# Patient Record
Sex: Female | Born: 1970 | Race: White | Hispanic: No | Marital: Married | Smoking: Never smoker
Health system: Southern US, Community
[De-identification: ages and names within clinical notes are randomized; demographics above are authoritative.]

## PROBLEM LIST (undated history)

## (undated) HISTORY — PX: HIP SURGERY: SHX245

## (undated) HISTORY — PX: ABDOMINAL HYSTERECTOMY: SHX81

---

## 2001-08-25 ENCOUNTER — Other Ambulatory Visit: Admission: RE | Admit: 2001-08-25 | Discharge: 2001-08-25 | Payer: Self-pay | Admitting: Obstetrics and Gynecology

## 2005-07-03 ENCOUNTER — Inpatient Hospital Stay (HOSPITAL_COMMUNITY): Admission: RE | Admit: 2005-07-03 | Discharge: 2005-07-07 | Payer: Self-pay | Admitting: Orthopedic Surgery

## 2020-12-10 ENCOUNTER — Observation Stay (HOSPITAL_COMMUNITY)
Admission: EM | Admit: 2020-12-10 | Discharge: 2020-12-11 | Disposition: A | Discharge status: Home / Self Care | Payer: BC Managed Care – PPO | Attending: Orthopedic Surgery | Admitting: Orthopedic Surgery

## 2020-12-10 ENCOUNTER — Emergency Department (HOSPITAL_COMMUNITY): Payer: BC Managed Care – PPO | Admitting: Certified Registered Nurse Anesthetist

## 2020-12-10 ENCOUNTER — Encounter (HOSPITAL_COMMUNITY): Payer: Self-pay | Admitting: *Deleted

## 2020-12-10 ENCOUNTER — Encounter (HOSPITAL_COMMUNITY)
Admission: EM | Disposition: A | Discharge status: Home / Self Care | Payer: Self-pay | Source: Home / Self Care | Attending: Emergency Medicine

## 2020-12-10 ENCOUNTER — Other Ambulatory Visit: Payer: Self-pay

## 2020-12-10 ENCOUNTER — Emergency Department (HOSPITAL_COMMUNITY): Payer: BC Managed Care – PPO

## 2020-12-10 ENCOUNTER — Observation Stay (HOSPITAL_COMMUNITY): Payer: BC Managed Care – PPO

## 2020-12-10 DIAGNOSIS — Y829 Unspecified medical devices associated with adverse incidents: Secondary | ICD-10-CM | POA: Diagnosis not present

## 2020-12-10 DIAGNOSIS — Z20822 Contact with and (suspected) exposure to covid-19: Secondary | ICD-10-CM | POA: Insufficient documentation

## 2020-12-10 DIAGNOSIS — T84020A Dislocation of internal right hip prosthesis, initial encounter: Principal | ICD-10-CM | POA: Insufficient documentation

## 2020-12-10 DIAGNOSIS — Z96641 Presence of right artificial hip joint: Secondary | ICD-10-CM | POA: Diagnosis present

## 2020-12-10 DIAGNOSIS — S73004A Unspecified dislocation of right hip, initial encounter: Secondary | ICD-10-CM

## 2020-12-10 HISTORY — PX: HIP CLOSED REDUCTION: SHX983

## 2020-12-10 LAB — POC URINE PREG, ED: Preg Test, Ur: NEGATIVE

## 2020-12-10 LAB — RESP PANEL BY RT-PCR (FLU A&B, COVID) ARPGX2
Influenza A by PCR: NEGATIVE
Influenza B by PCR: NEGATIVE
SARS Coronavirus 2 by RT PCR: NEGATIVE

## 2020-12-10 SURGERY — CLOSED MANIPULATION, JOINT, HIP
Anesthesia: General | Site: Hip

## 2020-12-10 MED ORDER — OXYCODONE HCL 5 MG PO TABS
5.0000 mg | ORAL_TABLET | Freq: Once | ORAL | Status: AC | PRN
  Administered 2020-12-10: 5 mg via ORAL

## 2020-12-10 MED ORDER — CEFAZOLIN SODIUM-DEXTROSE 2-4 GM/100ML-% IV SOLN
2.0000 g | Freq: Four times a day (QID) | INTRAVENOUS | Status: AC
Start: 2020-12-10 — End: 2020-12-11
  Administered 2020-12-10 – 2020-12-11 (×2): 2 g via INTRAVENOUS
  Filled 2020-12-10 (×2): qty 100

## 2020-12-10 MED ORDER — 0.9 % SODIUM CHLORIDE (POUR BTL) OPTIME
TOPICAL | Status: DC | PRN
  Administered 2020-12-10: 1000 mL

## 2020-12-10 MED ORDER — TRANEXAMIC ACID-NACL 1000-0.7 MG/100ML-% IV SOLN
1000.0000 mg | Freq: Once | INTRAVENOUS | Status: AC
  Administered 2020-12-10: 1000 mg via INTRAVENOUS
  Filled 2020-12-10: qty 100

## 2020-12-10 MED ORDER — CEFAZOLIN SODIUM-DEXTROSE 2-4 GM/100ML-% IV SOLN
INTRAVENOUS | Status: AC
  Filled 2020-12-10: qty 100

## 2020-12-10 MED ORDER — ONDANSETRON HCL 4 MG/2ML IJ SOLN: 4.0000 mg | Freq: Once | INTRAMUSCULAR | Status: DC | PRN

## 2020-12-10 MED ORDER — HYDROMORPHONE HCL 1 MG/ML IJ SOLN
INTRAMUSCULAR | Status: AC
  Filled 2020-12-10: qty 1

## 2020-12-10 MED ORDER — FENTANYL CITRATE (PF) 100 MCG/2ML IJ SOLN
INTRAMUSCULAR | Status: DC | PRN
  Administered 2020-12-10: 50 ug via INTRAVENOUS
  Administered 2020-12-10: 100 ug via INTRAVENOUS
  Administered 2020-12-10: 50 ug via INTRAVENOUS

## 2020-12-10 MED ORDER — OXYCODONE HCL 5 MG/5ML PO SOLN: 5.0000 mg | Freq: Once | ORAL | Status: AC | PRN

## 2020-12-10 MED ORDER — TRANEXAMIC ACID-NACL 1000-0.7 MG/100ML-% IV SOLN
INTRAVENOUS | Status: AC
  Filled 2020-12-10: qty 100

## 2020-12-10 MED ORDER — FENTANYL CITRATE (PF) 100 MCG/2ML IJ SOLN
INTRAMUSCULAR | Status: AC
  Filled 2020-12-10: qty 2

## 2020-12-10 MED ORDER — HYDROMORPHONE HCL 1 MG/ML IJ SOLN: 0.5000 mg | INTRAMUSCULAR | Status: DC | PRN

## 2020-12-10 MED ORDER — ONDANSETRON HCL 4 MG/2ML IJ SOLN
INTRAMUSCULAR | Status: AC
  Filled 2020-12-10: qty 2

## 2020-12-10 MED ORDER — ACETAMINOPHEN 325 MG PO TABS: 325.0000 mg | ORAL_TABLET | Freq: Four times a day (QID) | ORAL | Status: DC | PRN

## 2020-12-10 MED ORDER — METOCLOPRAMIDE HCL 5 MG/ML IJ SOLN
5.0000 mg | Freq: Once | INTRAMUSCULAR | Status: AC
  Administered 2020-12-10: 5 mg via INTRAVENOUS
  Filled 2020-12-10: qty 2

## 2020-12-10 MED ORDER — MIDAZOLAM HCL 5 MG/5ML IJ SOLN
INTRAMUSCULAR | Status: DC | PRN
  Administered 2020-12-10: 2 mg via INTRAVENOUS

## 2020-12-10 MED ORDER — HYDROMORPHONE HCL 1 MG/ML IJ SOLN
0.5000 mg | INTRAMUSCULAR | Status: DC | PRN
  Administered 2020-12-10: 0.5 mg via INTRAVENOUS
  Filled 2020-12-10: qty 1

## 2020-12-10 MED ORDER — LACTATED RINGERS IV SOLN: INTRAVENOUS | Status: DC | PRN

## 2020-12-10 MED ORDER — SUGAMMADEX SODIUM 200 MG/2ML IV SOLN
INTRAVENOUS | Status: DC | PRN
  Administered 2020-12-10: 200 mg via INTRAVENOUS

## 2020-12-10 MED ORDER — CEFAZOLIN SODIUM-DEXTROSE 2-3 GM-%(50ML) IV SOLR
INTRAVENOUS | Status: DC | PRN
  Administered 2020-12-10: 2 g via INTRAVENOUS

## 2020-12-10 MED ORDER — LACTATED RINGERS IV SOLN: INTRAVENOUS | Status: DC

## 2020-12-10 MED ORDER — LIDOCAINE HCL (PF) 2 % IJ SOLN
INTRAMUSCULAR | Status: AC
  Filled 2020-12-10: qty 5

## 2020-12-10 MED ORDER — POLYETHYLENE GLYCOL 3350 17 G PO PACK: 17.0000 g | PACK | Freq: Every day | ORAL | Status: DC | PRN

## 2020-12-10 MED ORDER — ASPIRIN 81 MG PO CHEW
81.0000 mg | CHEWABLE_TABLET | Freq: Two times a day (BID) | ORAL | Status: DC
  Administered 2020-12-10 – 2020-12-11 (×2): 81 mg via ORAL
  Filled 2020-12-10 (×2): qty 1

## 2020-12-10 MED ORDER — PROPOFOL 10 MG/ML IV BOLUS
INTRAVENOUS | Status: AC
  Filled 2020-12-10: qty 20

## 2020-12-10 MED ORDER — MIDAZOLAM HCL 2 MG/2ML IJ SOLN
INTRAMUSCULAR | Status: AC
  Filled 2020-12-10: qty 2

## 2020-12-10 MED ORDER — SODIUM CHLORIDE 0.9 % IR SOLN
Status: DC | PRN
  Administered 2020-12-10: 3000 mL

## 2020-12-10 MED ORDER — ONDANSETRON HCL 4 MG/2ML IJ SOLN: 4.0000 mg | Freq: Four times a day (QID) | INTRAMUSCULAR | Status: DC | PRN

## 2020-12-10 MED ORDER — ONDANSETRON HCL 4 MG/2ML IJ SOLN
4.0000 mg | Freq: Once | INTRAMUSCULAR | Status: AC
  Administered 2020-12-10: 4 mg via INTRAVENOUS
  Filled 2020-12-10: qty 2

## 2020-12-10 MED ORDER — TRANEXAMIC ACID-NACL 1000-0.7 MG/100ML-% IV SOLN
INTRAVENOUS | Status: DC | PRN
  Administered 2020-12-10: 1000 mg via INTRAVENOUS

## 2020-12-10 MED ORDER — ACETAMINOPHEN 10 MG/ML IV SOLN
INTRAVENOUS | Status: AC
  Filled 2020-12-10: qty 100

## 2020-12-10 MED ORDER — DOCUSATE SODIUM 100 MG PO CAPS
100.0000 mg | ORAL_CAPSULE | Freq: Two times a day (BID) | ORAL | Status: DC
  Administered 2020-12-10 – 2020-12-11 (×2): 100 mg via ORAL
  Filled 2020-12-10 (×2): qty 1

## 2020-12-10 MED ORDER — HYDROCODONE-ACETAMINOPHEN 5-325 MG PO TABS: 1.0000 | ORAL_TABLET | ORAL | Status: DC | PRN

## 2020-12-10 MED ORDER — LIDOCAINE 2% (20 MG/ML) 5 ML SYRINGE
INTRAMUSCULAR | Status: DC | PRN
  Administered 2020-12-10: 80 mg via INTRAVENOUS

## 2020-12-10 MED ORDER — METHOCARBAMOL 500 MG IVPB - SIMPLE MED
500.0000 mg | Freq: Four times a day (QID) | INTRAVENOUS | Status: DC | PRN
  Administered 2020-12-10: 500 mg via INTRAVENOUS
  Filled 2020-12-10: qty 500

## 2020-12-10 MED ORDER — MENTHOL 3 MG MT LOZG: 1.0000 | LOZENGE | OROMUCOSAL | Status: DC | PRN

## 2020-12-10 MED ORDER — ONDANSETRON HCL 4 MG/2ML IJ SOLN
INTRAMUSCULAR | Status: DC | PRN
  Administered 2020-12-10: 4 mg via INTRAVENOUS

## 2020-12-10 MED ORDER — MORPHINE SULFATE (PF) 2 MG/ML IV SOLN: 0.5000 mg | INTRAVENOUS | Status: DC | PRN

## 2020-12-10 MED ORDER — OXYCODONE HCL 5 MG PO TABS
ORAL_TABLET | ORAL | Status: AC
  Filled 2020-12-10: qty 1

## 2020-12-10 MED ORDER — METOCLOPRAMIDE HCL 5 MG/ML IJ SOLN: 5.0000 mg | Freq: Three times a day (TID) | INTRAMUSCULAR | Status: DC | PRN

## 2020-12-10 MED ORDER — PHENOL 1.4 % MT LIQD: 1.0000 | OROMUCOSAL | Status: DC | PRN

## 2020-12-10 MED ORDER — DEXAMETHASONE SODIUM PHOSPHATE 10 MG/ML IJ SOLN
INTRAMUSCULAR | Status: AC
  Filled 2020-12-10: qty 1

## 2020-12-10 MED ORDER — SODIUM CHLORIDE 0.9 % IV SOLN: INTRAVENOUS | Status: DC

## 2020-12-10 MED ORDER — PROPOFOL 10 MG/ML IV BOLUS
INTRAVENOUS | Status: DC | PRN
  Administered 2020-12-10: 50 mg via INTRAVENOUS
  Administered 2020-12-10: 150 mg via INTRAVENOUS

## 2020-12-10 MED ORDER — ROCURONIUM BROMIDE 10 MG/ML (PF) SYRINGE
PREFILLED_SYRINGE | INTRAVENOUS | Status: AC
  Filled 2020-12-10: qty 10

## 2020-12-10 MED ORDER — HYDROMORPHONE HCL 1 MG/ML IJ SOLN
0.2500 mg | INTRAMUSCULAR | Status: DC | PRN
  Administered 2020-12-10 (×6): 0.5 mg via INTRAVENOUS

## 2020-12-10 MED ORDER — DEXAMETHASONE SODIUM PHOSPHATE 10 MG/ML IJ SOLN
INTRAMUSCULAR | Status: DC | PRN
  Administered 2020-12-10: 8 mg via INTRAVENOUS

## 2020-12-10 MED ORDER — ROCURONIUM BROMIDE 10 MG/ML (PF) SYRINGE
PREFILLED_SYRINGE | INTRAVENOUS | Status: DC | PRN
  Administered 2020-12-10: 40 mg via INTRAVENOUS

## 2020-12-10 MED ORDER — HYDROCODONE-ACETAMINOPHEN 7.5-325 MG PO TABS
1.0000 | ORAL_TABLET | ORAL | Status: DC | PRN
  Administered 2020-12-10 – 2020-12-11 (×5): 2 via ORAL
  Filled 2020-12-10 (×5): qty 2

## 2020-12-10 MED ORDER — METHOCARBAMOL 500 MG PO TABS
500.0000 mg | ORAL_TABLET | Freq: Four times a day (QID) | ORAL | Status: DC | PRN
  Administered 2020-12-10 – 2020-12-11 (×3): 500 mg via ORAL
  Filled 2020-12-10 (×3): qty 1

## 2020-12-10 MED ORDER — SCOPOLAMINE 1 MG/3DAYS TD PT72
MEDICATED_PATCH | TRANSDERMAL | Status: AC
  Filled 2020-12-10: qty 1

## 2020-12-10 MED ORDER — ONDANSETRON HCL 4 MG PO TABS: 4.0000 mg | ORAL_TABLET | Freq: Four times a day (QID) | ORAL | Status: DC | PRN

## 2020-12-10 MED ORDER — METOCLOPRAMIDE HCL 5 MG PO TABS: 5.0000 mg | ORAL_TABLET | Freq: Three times a day (TID) | ORAL | Status: DC | PRN

## 2020-12-10 MED ORDER — ACETAMINOPHEN 10 MG/ML IV SOLN
1000.0000 mg | Freq: Once | INTRAVENOUS | Status: AC
  Administered 2020-12-10: 1000 mg via INTRAVENOUS

## 2020-12-10 SURGICAL SUPPLY — 38 items
ADH SKN CLS APL DERMABOND .7 (GAUZE/BANDAGES/DRESSINGS) ×1
BALL HIP CERAMIC (Hips) IMPLANT
BLADE SAW SGTL 11.0X1.19X90.0M (BLADE) IMPLANT
BNDG ADH 1X3 SHEER STRL LF (GAUZE/BANDAGES/DRESSINGS) IMPLANT
BNDG ADH THN 3X1 STRL LF (GAUZE/BANDAGES/DRESSINGS)
COVER SURGICAL LIGHT HANDLE (MISCELLANEOUS) ×3 IMPLANT
COVER WAND RF STERILE (DRAPES) IMPLANT
DERMABOND ADVANCED (GAUZE/BANDAGES/DRESSINGS) ×1
DERMABOND ADVANCED .7 DNX12 (GAUZE/BANDAGES/DRESSINGS) IMPLANT
DRSG AQUACEL AG ADV 3.5X10 (GAUZE/BANDAGES/DRESSINGS) ×1 IMPLANT
DURAPREP 26ML APPLICATOR (WOUND CARE) ×2 IMPLANT
GAUZE SPONGE 4X4 12PLY STRL (GAUZE/BANDAGES/DRESSINGS) IMPLANT
GLOVE BIOGEL M 7.0 STRL (GLOVE) IMPLANT
GLOVE BIOGEL PI IND STRL 7.5 (GLOVE) ×1 IMPLANT
GLOVE BIOGEL PI IND STRL 8.5 (GLOVE) ×1 IMPLANT
GLOVE BIOGEL PI INDICATOR 7.5 (GLOVE) ×1
GLOVE BIOGEL PI INDICATOR 8.5 (GLOVE) ×1
GLOVE ECLIPSE 8.0 STRL XLNG CF (GLOVE) IMPLANT
GLOVE ORTHO TXT STRL SZ7.5 (GLOVE) ×4 IMPLANT
GLOVE SURG ORTHO 8.0 STRL STRW (GLOVE) ×2 IMPLANT
GOWN STRL REUS W/TWL LRG LVL3 (GOWN DISPOSABLE) ×2 IMPLANT
GOWN STRL REUS W/TWL XL LVL3 (GOWN DISPOSABLE) ×4 IMPLANT
HANDPIECE INTERPULSE COAX TIP (DISPOSABLE) ×2
HIP BALL CERAMIC (Hips) ×2 IMPLANT
KIT TURNOVER KIT A (KITS) IMPLANT
MANIFOLD NEPTUNE II (INSTRUMENTS) ×2 IMPLANT
NDL SAFETY ECLIPSE 18X1.5 (NEEDLE) IMPLANT
NEEDLE HYPO 18GX1.5 SHARP (NEEDLE)
PACK ANTERIOR HIP CUSTOM (KITS) ×1 IMPLANT
PENCIL SMOKE EVACUATOR (MISCELLANEOUS) IMPLANT
PROTECTOR NERVE ULNAR (MISCELLANEOUS) ×2 IMPLANT
SET HNDPC FAN SPRY TIP SCT (DISPOSABLE) IMPLANT
SUT STRATAFIX PDS+ 0 24IN (SUTURE) ×1 IMPLANT
SUT VIC AB 1 CT1 36 (SUTURE) ×2 IMPLANT
SUT VIC AB 2-0 CT1 27 (SUTURE) ×6
SUT VIC AB 2-0 CT1 TAPERPNT 27 (SUTURE) IMPLANT
SYR CONTROL 10ML LL (SYRINGE) IMPLANT
TOWEL OR 17X26 10 PK STRL BLUE (TOWEL DISPOSABLE) ×4 IMPLANT

## 2020-12-10 NOTE — Brief Op Note (Signed)
12/10/2020  4:15 PM  PATIENT:  Julie Sloan  49 y.o. female  PRE-OPERATIVE DIAGNOSIS:  dislocated hip right total hip replacement  POST-OPERATIVE DIAGNOSIS:   dislocated hip right total hip replacement  PROCEDURE:  Procedure(s): revision right hip single componet (N/A)  SURGEON:  Surgeon(s) and Role:    Durene Romans, MD - Primary  PHYSICIAN ASSISTANT: None  ASSISTANTS: Surgical team  ANESTHESIA:   general  EBL:  200 mL   BLOOD ADMINISTERED:none  DRAINS: none   LOCAL MEDICATIONS USED:  NONE  SPECIMEN:  No Specimen  DISPOSITION OF SPECIMEN:  N/A  COUNTS:  YES  TOURNIQUET:  * No tourniquets in log *  DICTATION: .Other Dictation: Dictation Number [id number]  PLAN OF CARE: Admit for overnight observation  PATIENT DISPOSITION:  PACU - hemodynamically stable.   Delay start of Pharmacological VTE agent (>24hrs) due to surgical blood loss or risk of bleeding: no

## 2020-12-10 NOTE — Anesthesia Postprocedure Evaluation (Signed)
Anesthesia Post Note  Patient: Julie Sloan  Procedure(s) Performed: revision right hip single componet (N/A Hip)     Patient location during evaluation: PACU Anesthesia Type: General Level of consciousness: awake and alert and oriented Pain management: pain level controlled Vital Signs Assessment: post-procedure vital signs reviewed and stable Respiratory status: spontaneous breathing, nonlabored ventilation and respiratory function stable Cardiovascular status: blood pressure returned to baseline and stable Postop Assessment: no apparent nausea or vomiting Anesthetic complications: no   No complications documented.  Last Vitals:  Vitals:   12/10/20 1430 12/10/20 1446  BP: 117/69 (!) 129/54  Pulse: 92 93  Resp: 19 13  Temp:    SpO2: 100% 100%    Last Pain:  Vitals:   12/10/20 1446  TempSrc:   PainSc: 6                  Willette Mudry A.

## 2020-12-10 NOTE — Progress Notes (Signed)
Orthopedic Tech Progress Note Patient Details:  Julie Sloan Jun 15, 1971 [id number]  Ortho Devices Ortho Device/Splint Location: Trapeze bar Ortho Device/Splint Interventions: Application   Post Interventions Patient Tolerated: Well Instructions Provided: Care of device   Saul Fordyce 12/10/2020, 4:24 PM

## 2020-12-10 NOTE — ED Provider Notes (Signed)
[facility]-EMERGENCY DEPT Provider Note   CSN: [id number] Arrival date & time: 12/10/20  9892     History No chief complaint on file.   Julie Sloan is a 49 y.o. female transferred from [facility] hospital for closed reduction of right hip arthroplasty. Yesterday at 115 she was sitting in her truck when she felt of severe pain in her right hip. She is status post total hip arthroplasty 2 weeks ago. She was seen in the emergency department at [facility] yesterday and was taken to to the operating room for closed hip reduction however orthopedists were unable to align the hip. Patient was accepted in transfer by Dr. Anitra Lauth yesterday. Her orthopedic team is aware. They have requested she get a Covid test and will take her to the operating room. She denies any numbness or paresthesia. She does have severe pain.  HPI     No past medical history on file.  There are no problems to display for this patient.      OB History   No obstetric history on file.     No family history on file.     Home Medications Prior to Admission medications   Not on File    Allergies    Patient has no allergy information on record.  Review of Systems   Review of Systems Ten systems reviewed and are negative for acute change, except as noted in the HPI.   Physical Exam Updated Vital Signs There were no vitals taken for this visit.  Physical Exam Vitals and nursing note reviewed.  Constitutional:      General: She is not in acute distress.    Appearance: She is well-developed and well-nourished. She is not diaphoretic.  HENT:     Head: Normocephalic and atraumatic.  Eyes:     General: No scleral icterus.    Conjunctiva/sclera: Conjunctivae normal.  Cardiovascular:     Rate and Rhythm: Normal rate and regular rhythm.     Heart sounds: Normal heart sounds. No murmur heard. No friction rub. No gallop.   Pulmonary:     Effort: Pulmonary effort is normal. No  respiratory distress.     Breath sounds: Normal breath sounds.  Abdominal:     General: Bowel sounds are normal. There is no distension.     Palpations: Abdomen is soft. There is no mass.     Tenderness: There is no abdominal tenderness. There is no guarding.  Musculoskeletal:     Cervical back: Normal range of motion.     Comments: Right leg is shortened and internally rotated. The foot is warm and well-perfused with 2+ DP/PT pulse  Skin:    General: Skin is warm and dry.  Neurological:     Mental Status: She is alert and oriented to person, place, and time.  Psychiatric:        Behavior: Behavior normal.     ED Results / Procedures / Treatments   Labs (all labs ordered are listed, but only abnormal results are displayed) Labs Reviewed  RESP PANEL BY RT-PCR (FLU A&B, COVID) [id number]    EKG None  Radiology No results found.  Procedures Procedures (including critical care time)  Medications Ordered in ED Medications - No data to display  ED Course  I have reviewed the triage vital signs and the nursing notes.  Pertinent labs & imaging results that were available during my care of the patient were reviewed by me and considered in my medical decision making (  see chart for details).    MDM Rules/Calculators/A&P                         Patient Covid test negative. Safe to go to OR.  Final Clinical Impression(s) / ED Diagnoses Final diagnoses:  None    Rx / DC Orders ED Discharge Orders    None       Arthor Captain, PA-C 12/10/20 1831    Virgina Norfolk, DO 12/11/20 1436

## 2020-12-10 NOTE — ED Notes (Signed)
Nofitfied OR that patient was here and they will notify Dr. Charlann Boxer.

## 2020-12-10 NOTE — Plan of Care (Signed)
°  Problem: Pain Managment: Goal: General experience of comfort will improve 12/10/2020 1632 by Sherren Kerns, RN Outcome: Progressing 12/10/2020 1629 by Sherren Kerns, RN Outcome: Progressing

## 2020-12-10 NOTE — Op Note (Signed)
NAMEJOSSIE, Sloan MEDICAL RECORD [id number] ACCOUNT [id number] DATE OF BIRTH:01/07/1971 FACILITY: [facility] LOCATION: [facility] PHYSICIAN:Roderic Lammert D. Zula Hovsepian, MD  OPERATIVE REPORT  DATE OF PROCEDURE:  12/10/2020  PREOPERATIVE DIAGNOSIS:  Dislocated right total hip replacement that was not able to be reduced.  POSTOPERATIVE DIAGNOSIS:  Dislocated right total hip replacement that was not able to be reduced.  PROCEDURE:  Revision right total hip replacement, single component.  COMPONENT USED:  DePuy Delta ceramic 32+5 head ball.  SURGEON:  Durene Romans, MD  ASSISTANT:  Surgical team.  ANESTHESIA:  General.  BLOOD LOSS:  200 mL.  DRAINS:  None.  COMPLICATIONS:  None.  INDICATIONS OF PROCEDURE:  The patient is a very pleasant 49 year old female well known to me.  She had a history of right total hip replacement on 11/21/2020.  She was doing well and actually was seen in the office on the 22nd of this month at her first  postop visit without complaints or concerns.  However, the next day, she felt a shifting sensation in her hip, and then on Friday the 24th, she was sitting in her truck and felt her hip come out.  This resulted in significant amount of pain.  They live  in [place].  She was taken initially to [facility] where they attempted reduction, but were unsuccessful, and arranged for transfer to [facility] today.  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  Based on the fact that her hip was not able to be reduced, there was concern about soft tissue impingement.   I also felt that it was important for me to evaluate for stability of the hip is that we intentionally tried to not lengthen her right hip at the time of her primary surgery.  Unfortunately, she had been doing well and then this  occurred.  Consent was  obtained to manage her right hip as discussed.  Standard risk of recurrent dislocation and instability, DVT, and infection all reviewed.  Consent was obtained for benefit of pain relief.  DESCRIPTION OF PROCEDURE: The patient was brought to the operative theater.  Once adequate anesthesia, preoperative antibiotics, Ancef administered as well as tranexamic acid, she was positioned supine on the Hana table.  The right hip was then positioned with all bony  prominences carefully padded and protected.  The right hip was then pre-draped, then prepped and draped in sterile fashion.  A timeout was performed identifying the patient, planned procedure, and extremity.  Her old incision had been marked out.  We  utilized this incision.  Soft tissue dissection was carried down to the fascia of the tensor fascia lata.  This fascia was incised in her old incision line.  The muscle belly was then swept laterally.  We encountered a large seroma, but no signs of  infection.  Further exposure of the hip was carried out in routine fashion with retractor placement.  I elected at this point to excise the anterior capsule, which had appeared as scarred.  I did this for evaluation of her hip, but also to try to  eliminate any potential source of impingement given the instability.  With the hip exposed, I then applied traction and external rotation to 100 degrees and dislocated the hip.  I removed the femoral head.  This allowed for further debridement around the  acetabulum of all soft tissues anterior and posterior that could potentially lead to instability.  I then at this point did trial reductions.  Given our discussion, I did trial the +9 ball, but then went back to the +5 ball.  With +5 ball reduced, she  did have a little bit of shuck, but it was not excessive at all, and there were no signs of impingement.  Cup position anatomically appeared to be adequate with regards to anteversion in  relationship to her pelvis as well as confirmed radiographically  with regards to her abduction.  Given these findings, the final 32+5 delta ceramic ball was selected.  It was opened and then impacted on a clean and dried trunnion.  I at this point irrigated the hip with about 1500 mL of normal saline solution with  pulse lavage.  Once this was completed, final radiographs were utilized with intraoperative fluoroscopy.  I reapproximated the fascia of the tensor fascia using #1 Vicryl and a running Stratafix suture.  The remainder of the wound was closed in layers  with 2-0 Vicryl and a running Monocryl stitch.  The hip was then cleaned, dried and dressed sterilely with Dermabond and Aquacel dressing.  Her wound appeared to be very stable.  She was then awoken from anesthesia and brought to the recovery room in  stable condition tolerating the procedure well.  Findings were reviewed with her husband.  I will keep him in the hospital overnight for observation to receive 2 doses of antibiotics and to review findings and have him work with physical therapy to make  certain that she feels comfortable.  We will then follow up with her in 2 weeks in the office.  IN/NUANCE  D:12/10/2020 T:12/10/2020 JOB:[id number]

## 2020-12-10 NOTE — Anesthesia Preprocedure Evaluation (Addendum)
Anesthesia Evaluation  Patient identified by MRN, date of birth, ID band Patient awake    Reviewed: Allergy & Precautions, NPO status , Patient's Chart, lab work & pertinent test results  Airway Mallampati: II  TM Distance: >3 FB Neck ROM: Full    Dental no notable dental hx. (+) Teeth Intact   Pulmonary neg pulmonary ROS,    Pulmonary exam normal breath sounds clear to auscultation       Cardiovascular negative cardio ROS Normal cardiovascular exam Rhythm:Regular Rate:Normal     Neuro/Psych Depression negative neurological ROS     GI/Hepatic negative GI ROS, Neg liver ROS,   Endo/Other  Obesity  Renal/GU negative Renal ROS  negative genitourinary   Musculoskeletal  (+) Arthritis , Osteoarthritis,  Dislocated right hip S/P Right hip arthroplasty   Abdominal (+) + obese,   Peds  Hematology negative hematology ROS (+)   Anesthesia Other Findings   Reproductive/Obstetrics                            Anesthesia Physical Anesthesia Plan  ASA: II  Anesthesia Plan: General   Post-op Pain Management:    Induction: Intravenous  PONV Risk Score and Plan: 4 or greater and Midazolam, Treatment may vary due to age or medical condition and Ondansetron  Airway Management Planned: LMA  Additional Equipment:   Intra-op Plan:   Post-operative Plan: Extubation in OR  Informed Consent: I have reviewed the patients History and Physical, chart, labs and discussed the procedure including the risks, benefits and alternatives for the proposed anesthesia with the patient or authorized representative who has indicated his/her understanding and acceptance.     Dental advisory given  Plan Discussed with: CRNA and Anesthesiologist  Anesthesia Plan Comments:        Anesthesia Quick Evaluation

## 2020-12-10 NOTE — Plan of Care (Signed)
  Problem: Clinical Measurements: Goal: Respiratory complications will improve Outcome: Progressing   Problem: Clinical Measurements: Goal: Cardiovascular complication will be avoided Outcome: Progressing   Problem: Elimination: Goal: Will not experience complications related to bowel motility Outcome: Progressing   Problem: Skin Integrity: Goal: Risk for impaired skin integrity will decrease Outcome: Progressing   

## 2020-12-10 NOTE — Progress Notes (Signed)
Pt will need 3 in 1 dme prior to discharge. rn will discuss with rounding md tomorrow.

## 2020-12-10 NOTE — Progress Notes (Signed)
Pt stable on arrival to floor. No needs on arrival to floor. Assessment and vital signs performed. Rn will continue to monitor.  

## 2020-12-10 NOTE — H&P (Addendum)
Julie Sloan is an 49 y.o. female.    Chief Complaint: dislocated right total hip replacement   HPI: Pt is a 49 y.o. female reports dislocating her right hip while sitting in her truck.  She is about 2.5 weeks out from her index right THR.  She was seen in the office on Wednesday this week and was doing well without reported incident then the next day she felt a shift sensation that startled her.  Then she reports sitting in her car when she felt the hip pop out resulting in excruciating pain.  PCP:  Patient, No Pcp Per  D/C Plans: To be determined following appropriate treatment plan  PMH: History reviewed. No pertinent past medical history.  PSH: Past Surgical History:  Procedure Laterality Date  . ABDOMINAL HYSTERECTOMY    . HIP SURGERY      Social History:  reports that she has never smoked. She has never used smokeless tobacco. She reports that she does not drink alcohol and does not use drugs.  Allergies:  Allergies  Allergen Reactions  . Codeine Nausea And Vomiting  . Levaquin [Levofloxacin] Nausea And Vomiting    Medications: (Not in a hospital admission)   Results for orders placed or performed during the hospital encounter of 12/10/20 (from the past 48 hour(s))  Resp Panel by RT-PCR (Flu A&B, Covid) Nasopharyngeal Swab     Status: None   Collection Time: 12/10/20  9:46 AM   Specimen: Nasopharyngeal Swab; Nasopharyngeal(NP) swabs in vial transport medium  Result Value Ref Range   SARS Coronavirus 2 by RT PCR NEGATIVE NEGATIVE    Comment: (NOTE) SARS-CoV-2 target nucleic acids are NOT DETECTED.  The SARS-CoV-2 RNA is generally detectable in upper respiratory specimens during the acute phase of infection. The lowest concentration of SARS-CoV-2 viral copies this assay can detect is 138 copies/mL. A negative result does not preclude SARS-Cov-2 infection and should not be used as the sole basis for treatment or other patient management decisions. A negative result  may occur with  improper specimen collection/handling, submission of specimen other than nasopharyngeal swab, presence of viral mutation(s) within the areas targeted by this assay, and inadequate number of viral copies(<138 copies/mL). A negative result must be combined with clinical observations, patient history, and epidemiological information. The expected result is Negative.  Fact Sheet for Patients:  BloggerCourse.com  Fact Sheet for Healthcare Providers:  SeriousBroker.it  This test is no t yet approved or cleared by the [place] FDA and  has been authorized for detection and/or diagnosis of SARS-CoV-2 by FDA under an Emergency Use Authorization (EUA). This EUA will remain  in effect (meaning this test can be used) for the duration of the COVID-19 declaration under Section 564(b)(1) of the Act, 21 U.S.C.section 360bbb-3(b)(1), unless the authorization is terminated  or revoked sooner.       Influenza A by PCR NEGATIVE NEGATIVE   Influenza B by PCR NEGATIVE NEGATIVE    Comment: (NOTE) The Xpert Xpress SARS-CoV-2/FLU/RSV plus assay is intended as an aid in the diagnosis of influenza from Nasopharyngeal swab specimens and should not be used as a sole basis for treatment. Nasal washings and aspirates are unacceptable for Xpert Xpress SARS-CoV-2/FLU/RSV testing.  Fact Sheet for Patients: BloggerCourse.com  Fact Sheet for Healthcare Providers: SeriousBroker.it  This test is not yet approved or cleared by the [place] FDA and has been authorized for detection and/or diagnosis of SARS-CoV-2 by FDA under an Emergency Use Authorization (EUA). This EUA will remain in  effect (meaning this test can be used) for the duration of the COVID-19 declaration under Section 564(b)(1) of the Act, 21 U.S.C. section 360bbb-3(b)(1), unless the authorization is terminated  or revoked.  Performed at [facility], [address] [place], [place] [address]   POC Urine Pregnancy, ED (not at [facility])     Status: None   Collection Time: 12/10/20 10:40 AM  Result Value Ref Range   Preg Test, Ur NEGATIVE NEGATIVE    Comment:        THE SENSITIVITY OF THIS METHODOLOGY IS >24 mIU/mL    No results found.  ROS: Review of Systems - Negative except that reported in her H&P  Physican Exam: Blood pressure 103/62, pulse 79, temperature 98.8 F (37.1 C), temperature source Oral, resp. rate 13, height 5\' 5"  (1.651 m), weight 83.9 kg, SpO2 96 %. Awake and alert, in pain Normal breathing without wheezing Heart regular rate  No abdominal pain  RLE externally rotated NVI distally  Assessment/Plan Assessment:  Acute dislocation of her right total hip replacment  Plan: Given the circumstances I am going to take her to the OR and perform an open reduction to assess for soft tissue impingement verus other issue resulting in early instability Consent signed Post op I will keep in over night for IV antibiotics and pain control PT       . Madlyn Frankel, MD  12/10/2020, 12:42 PM

## 2020-12-10 NOTE — Transfer of Care (Signed)
Immediate Anesthesia Transfer of Care Note  Patient: Julie Sloan  Procedure(s) Performed: revision right hip single componet (N/A Hip)  Patient Location: PACU  Anesthesia Type:General  Level of Consciousness: drowsy and patient cooperative  Airway & Oxygen Therapy: Patient Spontanous Breathing and Patient connected to face mask oxygen  Post-op Assessment: Report given to RN and Post -op Vital signs reviewed and stable  Post vital signs: Reviewed and stable  Last Vitals:  Vitals Value Taken Time  BP 118/71 12/10/20 1428  Temp    Pulse 92 12/10/20 1429  Resp 19 12/10/20 1429  SpO2 100 % 12/10/20 1429  Vitals shown include unvalidated device data.  Last Pain:  Vitals:   12/10/20 1105  TempSrc:   PainSc: 3          Complications: No complications documented.

## 2020-12-10 NOTE — Anesthesia Procedure Notes (Signed)
Procedure Name: Intubation Date/Time: 12/10/2020 12:56 PM Performed by: Montel Clock, CRNA Pre-anesthesia Checklist: Patient identified, Emergency Drugs available, Suction available, Patient being monitored and Timeout performed Patient Re-evaluated:Patient Re-evaluated prior to induction Oxygen Delivery Method: Circle system utilized Preoxygenation: Pre-oxygenation with 100% oxygen Induction Type: IV induction Ventilation: Mask ventilation without difficulty Laryngoscope Size: Mac and 3 Grade View: Grade I Tube type: Oral Tube size: 7.0 mm Number of attempts: 1 Airway Equipment and Method: Stylet Placement Confirmation: ETT inserted through vocal cords under direct vision,  positive ETCO2 and breath sounds checked- equal and bilateral Secured at: 21 cm Tube secured with: Tape Dental Injury: Teeth and Oropharynx as per pre-operative assessment

## 2020-12-11 MED ORDER — CEPHALEXIN 500 MG PO CAPS: 500.0000 mg | ORAL_CAPSULE | Freq: Three times a day (TID) | ORAL | 0 refills | Status: AC

## 2020-12-11 MED ORDER — ASPIRIN 81 MG PO TBEC: 81.0000 mg | DELAYED_RELEASE_TABLET | Freq: Two times a day (BID) | ORAL | 0 refills | Status: AC

## 2020-12-11 MED ORDER — FERROUS SULFATE 325 (65 FE) MG PO TABS: 325.0000 mg | ORAL_TABLET | Freq: Two times a day (BID) | ORAL | 0 refills | Status: AC

## 2020-12-11 MED ORDER — METHOCARBAMOL 500 MG PO TABS: 500.0000 mg | ORAL_TABLET | Freq: Four times a day (QID) | ORAL | 1 refills | Status: AC | PRN

## 2020-12-11 MED ORDER — HYDROCODONE-ACETAMINOPHEN 5-325 MG PO TABS: 2.0000 | ORAL_TABLET | ORAL | 0 refills | Status: AC | PRN

## 2020-12-11 MED ORDER — CEPHALEXIN 500 MG PO CAPS
500.0000 mg | ORAL_CAPSULE | Freq: Three times a day (TID) | ORAL | Status: DC
  Administered 2020-12-11: 500 mg via ORAL
  Filled 2020-12-11: qty 1

## 2020-12-11 NOTE — Progress Notes (Signed)
Physical Therapy Treatment Patient Details Name: Julie Sloan MRN: [id number] DOB: [birth year] Today's Date: 12/11/2020    History of Present Illness Pt s/p R THR revision following dislocation - performed by ant direct approach.  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    PT Comments    Pt up to ambulate in hall with mobilizing well.    Follow Up Recommendations  Follow surgeon's recommendation for DC plan and follow-up therapies     Equipment Recommendations  Rolling walker with 5" wheels;3in1 (PT)    Recommendations for Other Services       Precautions / Restrictions Precautions Precautions: Fall;Other (comment) Precaution Comments: Per Dr Charlann Boxer, pt with no true precautions but requested pt be educated on posterior THP for better awareness since she had dislocated posteriorly Restrictions Weight Bearing Restrictions: No Other Position/Activity Restrictions: WBAT    Mobility  Bed Mobility Overal bed mobility: Needs Assistance Bed Mobility: Supine to Sit     Supine to sit: Min assist     General bed mobility comments: cues for sequence and use of L LE to self assist  Transfers Overall transfer level: Needs assistance Equipment used: Rolling walker (2 wheeled) Transfers: Sit to/from Stand Sit to Stand: Min assist;Min guard         General transfer comment: cues for LE management and use of UEs to self assist  Ambulation/Gait Ambulation/Gait assistance: Min assist;Min guard Gait Distance (Feet): 160 Feet Assistive device: Rolling walker (2 wheeled) Gait Pattern/deviations: Step-to pattern;Step-through pattern;Decreased step length - right;Decreased step length - left;Shuffle;Trunk flexed     General Gait Details: cues for posture, position from RW and intial sequence   Stairs             Wheelchair Mobility    Modified Rankin (Stroke Patients Only)       Balance Overall balance assessment: Mild deficits observed, not formally tested                                           Cognition Arousal/Alertness: Awake/alert Behavior During Therapy: [facility] for tasks assessed/performed;Anxious Overall Cognitive Status: Within Functional Limits for tasks assessed                                        Exercises Total Joint Exercises Ankle Circles/Pumps: AROM;Both;15 reps;Supine Quad Sets: AROM;Both;10 reps;Supine Heel Slides: AAROM;Right;20 reps;Supine Hip ABduction/ADduction: AAROM;Right;15 reps;Supine    General Comments        Pertinent Vitals/Pain Pain Assessment: 0-10 Pain Score: 2  Pain Location: r hip Pain Descriptors / Indicators: Guarding;Pressure;Heaviness Pain Intervention(s): Limited activity within patient's tolerance;Monitored during session;Premedicated before session    Home Living Family/patient expects to be discharged to:: Private residence Living Arrangements: Spouse/significant other Available Help at Discharge: Family Type of Home: House Home Access: Level entry   Home Layout: Two level Home Equipment: Environmental consultant - standard      Prior Function Level of Independence: Independent with assistive device(s)      Comments: using standard walker since [facility] 11/21/20   PT Goals (current goals can now be found in the care plan section) Acute Rehab PT Goals Patient Stated Goal: never have another dislocation PT Goal Formulation: With patient Time For Goal Achievement: 12/11/20 Potential to Achieve Goals: Good Progress towards PT goals: Progressing toward goals  Frequency    7X/week      PT Plan Current plan remains appropriate    Co-evaluation              AM-PAC PT "6 Clicks" Mobility   Outcome Measure  Help needed turning from your back to your side while in a flat bed without using bedrails?: A Little Help needed moving from lying on your back to sitting on the side of a flat bed without using bedrails?: A Little Help needed moving to and from a bed to a chair  (including a wheelchair)?: A Little Help needed standing up from a chair using your arms (e.g., wheelchair or bedside chair)?: A Little Help needed to walk in hospital room?: A Little Help needed climbing 3-5 steps with a railing? : A Little 6 Click Score: 18    End of Session Equipment Utilized During Treatment: Gait belt Activity Tolerance: Patient tolerated treatment well Patient left: with call bell/phone within reach;with family/visitor present;in chair Nurse Communication: Mobility status PT Visit Diagnosis: Difficulty in walking, not elsewhere classified (R26.2)     Time: 0350-0938 PT Time Calculation (min) (ACUTE ONLY): 19 min  Charges:  $Gait Training: 8-22 mins                     Mauro Kaufmann PT Acute Rehabilitation Services Pager [phone] Office [phone]    Denita Lun 12/11/2020, 12:59 PM

## 2020-12-11 NOTE — Evaluation (Signed)
Physical Therapy Evaluation Patient Details Name: Julie Sloan MRN: [id number] DOB: December 19, 1970 Today's Date: 12/11/2020   History of Present Illness  Pt s/p R THR revision following dislocation - performed by ant direct approach.  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  Clinical Impression  Pt s/p R THR revision and presents with functional mobility limitations 2* decreased R LE strength/ROM, post op pain, increased anxiety regarding dislocation and modified posterior THP.  Pt should progress to dc home with family assist.    Follow Up Recommendations Follow surgeon's recommendation for DC plan and follow-up therapies    Equipment Recommendations  Rolling walker with 5" wheels;3in1 (PT)    Recommendations for Other Services       Precautions / Restrictions Precautions Precautions: Fall;Other (comment) Precaution Comments: Per Dr Charlann Boxer, pt with no true precautions but requested pt be educated on posterior THP for better awareness since she had dislocated posteriorly Restrictions Weight Bearing Restrictions: No Other Position/Activity Restrictions: WBAT      Mobility  Bed Mobility               General bed mobility comments: OOB deferred to after breakfast    Transfers                    Ambulation/Gait                Stairs            Wheelchair Mobility    Modified Rankin (Stroke Patients Only)       Balance                                             Pertinent Vitals/Pain Pain Assessment: 0-10 Pain Score: 2  Pain Location: r hip Pain Descriptors / Indicators: Guarding;Pressure;Heaviness Pain Intervention(s): Limited activity within patient's tolerance;Monitored during session;Premedicated before session    Home Living Family/patient expects to be discharged to:: Private residence Living Arrangements: Spouse/significant other Available Help at Discharge: Family Type of Home: House Home Access: Level entry     Home  Layout: Two level Home Equipment: Environmental consultant - standard      Prior Function Level of Independence: Independent with assistive device(s)         Comments: using standard walker since [facility] 11/21/20     Hand Dominance        Extremity/Trunk Assessment   Upper Extremity Assessment Upper Extremity Assessment: Overall WFL for tasks assessed    Lower Extremity Assessment Lower Extremity Assessment: RLE deficits/detail RLE Deficits / Details: 2/5 strength at hip with AAROM at hip to 15 abd and 75 flex    Cervical / Trunk Assessment Cervical / Trunk Assessment: Normal  Communication   Communication: No difficulties  Cognition Arousal/Alertness: Awake/alert Behavior During Therapy: WFL for tasks assessed/performed;Anxious Overall Cognitive Status: Within Functional Limits for tasks assessed                                        General Comments      Exercises Total Joint Exercises Ankle Circles/Pumps: AROM;Both;15 reps;Supine Quad Sets: AROM;Both;10 reps;Supine Heel Slides: AAROM;Right;20 reps;Supine Hip ABduction/ADduction: AAROM;Right;15 reps;Supine   Assessment/Plan    PT Assessment Patient needs continued PT services  PT Problem List Decreased strength;Decreased range of motion;Decreased activity tolerance;Decreased balance;Decreased  mobility;Decreased knowledge of use of DME;Pain       PT Treatment Interventions DME instruction;Gait training;Stair training;Functional mobility training;Therapeutic activities;Therapeutic exercise;Patient/family education    PT Goals (Current goals can be found in the Care Plan section)  Acute Rehab PT Goals Patient Stated Goal: never have another dislocation PT Goal Formulation: With patient Time For Goal Achievement: 12/11/20 Potential to Achieve Goals: Good    Frequency 7X/week   Barriers to discharge        Co-evaluation               AM-PAC PT "6 Clicks" Mobility  Outcome Measure Help needed  turning from your back to your side while in a flat bed without using bedrails?: A Little Help needed moving from lying on your back to sitting on the side of a flat bed without using bedrails?: A Little Help needed moving to and from a bed to a chair (including a wheelchair)?: A Little Help needed standing up from a chair using your arms (e.g., wheelchair or bedside chair)?: A Little Help needed to walk in hospital room?: A Little Help needed climbing 3-5 steps with a railing? : A Little 6 Click Score: 18    End of Session   Activity Tolerance: Patient tolerated treatment well Patient left: in bed;with call bell/phone within reach;with family/visitor present   PT Visit Diagnosis: Difficulty in walking, not elsewhere classified (R26.2)    Time: 2876-8115 PT Time Calculation (min) (ACUTE ONLY): 17 min   Charges:   PT Evaluation $PT Eval Low Complexity: 1 Low          Mauro Kaufmann PT Acute Rehabilitation Services Pager [phone] Office [phone]   Jamie Hafford 12/11/2020, 12:52 PM

## 2020-12-11 NOTE — Progress Notes (Signed)
Physical Therapy Treatment Patient Details Name: Julie Sloan MRN: [id number] DOB: 06/02/1971 Today's Date: 12/11/2020    History of Present Illness Pt s/p R THR revision following dislocation - performed by ant direct approach.  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    PT Comments    Pt very motivated and with decreasing anxiety this session.  Pt ambulated in hall, negotiated stairs, reviewed car transfers, reviewed lower body dressing, and performed HEP with assist - written instruction provided and reviewed.   Spouse present for full session.  Follow Up Recommendations  Follow surgeon's recommendation for DC plan and follow-up therapies     Equipment Recommendations  Rolling walker with 5" wheels;3in1 (PT)    Recommendations for Other Services       Precautions / Restrictions Precautions Precautions: Fall;Other (comment) Precaution Comments: Per Dr Charlann Boxer, pt with no true precautions but requested pt be educated on posterior THP for better awareness since she had dislocated posteriorly Restrictions Weight Bearing Restrictions: No Other Position/Activity Restrictions: WBAT    Mobility  Bed Mobility Overal bed mobility: Needs Assistance Bed Mobility: Supine to Sit;Sit to Supine     Supine to sit: Min guard Sit to supine: Min guard   General bed mobility comments: Increased time with cues for sequence and use of L LE to self assist  Transfers Overall transfer level: Needs assistance Equipment used: Rolling walker (2 wheeled) Transfers: Sit to/from Stand Sit to Stand: Min guard;Supervision         General transfer comment: cues for LE management and use of UEs to self assist  Ambulation/Gait Ambulation/Gait assistance: Min guard;Supervision Gait Distance (Feet): 150 Feet Assistive device: Rolling walker (2 wheeled) Gait Pattern/deviations: Step-to pattern;Step-through pattern;Decreased step length - right;Decreased step length - left;Shuffle;Trunk flexed     General  Gait Details: cues for posture, position from RW and intial sequence   Stairs Stairs: Yes Stairs assistance: Min assist Stair Management: One rail Right;Step to pattern;Sideways Number of Stairs: 2 General stair comments: cues for sequence and foot placement   Wheelchair Mobility    Modified Rankin (Stroke Patients Only)       Balance Overall balance assessment: Mild deficits observed, not formally tested                                          Cognition Arousal/Alertness: Awake/alert Behavior During Therapy: WFL for tasks assessed/performed;Anxious Overall Cognitive Status: Within Functional Limits for tasks assessed                                        Exercises Total Joint Exercises Ankle Circles/Pumps: AROM;Both;15 reps;Supine Quad Sets: AROM;Both;10 reps;Supine Heel Slides: AAROM;Right;Supine;10 reps Hip ABduction/ADduction: AAROM;Right;Supine;10 reps Long Arc Quad: AROM;Right;10 reps;Seated    General Comments        Pertinent Vitals/Pain Pain Assessment: 0-10 Pain Score: 2  Pain Location: r hip Pain Descriptors / Indicators: Guarding;Pressure;Heaviness Pain Intervention(s): Limited activity within patient's tolerance;Monitored during session;Premedicated before session    Home Living Family/patient expects to be discharged to:: Private residence Living Arrangements: Spouse/significant other Available Help at Discharge: Family Type of Home: House Home Access: Level entry   Home Layout: Two level Home Equipment: Environmental consultant - standard      Prior Function Level of Independence: Independent with assistive device(s)  Comments: using standard walker since [facility] 11/21/20   PT Goals (current goals can now be found in the care plan section) Acute Rehab PT Goals Patient Stated Goal: never have another dislocation PT Goal Formulation: With patient Time For Goal Achievement: 12/11/20 Potential to Achieve Goals:  Good Progress towards PT goals: Progressing toward goals    Frequency    7X/week      PT Plan Current plan remains appropriate    Co-evaluation              AM-PAC PT "6 Clicks" Mobility   Outcome Measure  Help needed turning from your back to your side while in a flat bed without using bedrails?: A Little Help needed moving from lying on your back to sitting on the side of a flat bed without using bedrails?: A Little Help needed moving to and from a bed to a chair (including a wheelchair)?: A Little Help needed standing up from a chair using your arms (e.g., wheelchair or bedside chair)?: A Little Help needed to walk in hospital room?: A Little Help needed climbing 3-5 steps with a railing? : A Little 6 Click Score: 18    End of Session Equipment Utilized During Treatment: Gait belt Activity Tolerance: Patient tolerated treatment well Patient left: with call bell/phone within reach;with family/visitor present;in chair Nurse Communication: Mobility status PT Visit Diagnosis: Difficulty in walking, not elsewhere classified (R26.2)     Time: 2426-8341 PT Time Calculation (min) (ACUTE ONLY): 41 min  Charges:  $Gait Training: 8-22 mins $Therapeutic Exercise: 8-22 mins $Therapeutic Activity: 8-22 mins                     Mauro Kaufmann PT Acute Rehabilitation Services Pager [phone] Office [phone]    Julie Sloan 12/11/2020, 1:05 PM

## 2020-12-11 NOTE — Plan of Care (Signed)
Patient discharging all care plans met.

## 2020-12-11 NOTE — Progress Notes (Signed)
Patient ID: Julie Sloan, female   DOB: 01-Aug-1971, 49 y.o.   MRN: [id number] Subjective: 1 Day Post-Op Procedure(s) (LRB): revision right hip single componet (N/A)    Patient reports pain as mild.  No events over night. Reviewed events and intra-operative findings and procedure performed  Objective:   VITALS:   Vitals:   12/11/20 0119 12/11/20 0542  BP: (!) 107/59 114/61  Pulse: 74 72  Resp: 16 18  Temp: 98.5 F (36.9 C) 97.9 F (36.6 C)  SpO2: 100% 100%    Neurovascular intact Incision: dressing C/D/I  LABS No results for input(s): HGB, HCT, WBC, PLT in the last 72 hours.  No results for input(s): NA, K, BUN, CREATININE, GLUCOSE in the last 72 hours.  No results for input(s): LABPT, INR in the last 72 hours.   Assessment/Plan: 1 Day Post-Op Procedure(s) (LRB): revision right hip single componet (N/A)   Up with therapy  Home today Will order for her to get a 3 in 1 commode RTC in 2 weeks Keflex for 7 days to reduce risks of infection in setting of early post op procedure Rx sent

## 2020-12-11 NOTE — TOC Initial Note (Signed)
Transition of Care [facility]) - Initial/Assessment Note    Patient Details  Name: Julie Sloan MRN: [id number] Date of Birth: February 11, 1971  Transition of Care (TOC) CM/SW Contact:    Armanda Heritage, RN Phone Number: 12/11/2020, 10:39 AM  Clinical Narrative:                 [employer] to deliver rolling walker and 3in1 to bedside for home use.  Expected Discharge Plan: Home/Self Care Barriers to Discharge: No Barriers Identified   Patient Goals and CMS Choice Patient states their goals for this hospitalization and ongoing recovery are:: to go home      Expected Discharge Plan and Services Expected Discharge Plan: Home/Self Care   Discharge Planning Services: CM Consult     Expected Discharge Date: 12/11/20               DME Arranged: Dan Humphreys rolling,3-N-1 DME Agency: [employer] Date DME Agency Contacted: 12/11/20 Time DME Agency Contacted: 1036 Representative spoke with at DME Agency: Lucrecia            Prior Living Arrangements/Services   Lives with:: Self Patient language and need for interpreter reviewed:: Yes Do you feel safe going back to the place where you live?: Yes      Need for Family Participation in Patient Care: Yes (Comment) Care giver support system in place?: Yes (comment)   Criminal Activity/Legal Involvement Pertinent to Current Situation/Hospitalization: No - Comment as needed  Activities of Daily Living Home Assistive Devices/Equipment: Dan Humphreys (specify type) ADL Screening (condition at time of admission) Patient's cognitive ability adequate to safely complete daily activities?: Yes Is the patient deaf or have difficulty hearing?: No Does the patient have difficulty seeing, even when wearing glasses/contacts?: No Does the patient have difficulty concentrating, remembering, or making decisions?: No Patient able to express need for assistance with ADLs?: Yes Does the patient have difficulty dressing or bathing?: No Independently performs ADLs?:  Yes (appropriate for developmental age) Does the patient have difficulty walking or climbing stairs?: Yes Weakness of Legs: Both Weakness of Arms/Hands: None  Permission Sought/Granted                  Emotional Assessment Appearance:: Appears stated age Attitude/Demeanor/Rapport: Engaged Affect (typically observed): Accepting Orientation: : Oriented to Self,Oriented to Place,Oriented to  Time,Oriented to Situation   Psych Involvement: No (comment)  Admission diagnosis:  Dislocated hip, right, initial encounter (HCC) [S73.004A] History of revision of total replacement of right hip joint [Z96.641] Patient Active Problem List   Diagnosis Date Noted  . History of revision of total replacement of right hip joint 12/10/2020   PCP:  Patient, No Pcp Per Pharmacy:   [id number] - [place], [place] - [address] [facility] [address] [place] [address] Phone: [phone] Fax: [phone]     Social Determinants of Health (SDOH) Interventions    Readmission Risk Interventions No flowsheet data found.

## 2020-12-11 NOTE — Discharge Instructions (Signed)

## 2020-12-11 NOTE — Plan of Care (Signed)
  Problem: Education: Goal: Knowledge of General Education information will improve Description: Including pain rating scale, medication(s)/side effects and non-pharmacologic comfort measures Outcome: Progressing   Problem: Activity: Goal: Risk for activity intolerance will decrease Outcome: Progressing   Problem: Nutrition: Goal: Adequate nutrition will be maintained Outcome: Progressing   

## 2020-12-13 ENCOUNTER — Encounter (HOSPITAL_COMMUNITY): Payer: Self-pay | Admitting: Orthopedic Surgery

## 2020-12-19 NOTE — Discharge Summary (Signed)
Physician Discharge Summary   Patient ID: Julie Sloan MRN: [id number] DOB/AGE: [birth year] 50 y.o.  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   Admission Diagnoses:  History reviewed. No pertinent past medical history. Discharge Diagnoses:   Active Problems:   History of revision of total replacement of right hip joint  Estimated body mass index is 30.79 kg/m as calculated from the following:   Height as of this encounter: 5\' 5"  (1.651 m).   Weight as of this encounter: 83.9 kg.  Procedure:  Procedure(s) (LRB): revision right hip single componet (N/A)   Consults: None  HPI:  The patient is a very pleasant 50 year old female well known to me.  She had a history of right total hip replacement on 11/21/2020.  She was doing well and actually was seen in the office on the 22nd of this month at her first  postop visit without complaints or concerns.  However, the next day, she felt a shifting sensation in her hip, and then on Friday the 24th, she was sitting in her truck and felt her hip come out.  This resulted in significant amount of pain.  [place]  She was taken initially to [facility] where they attempted reduction, but were unsuccessful, and arranged for transfer to [facility] today.  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  Based on the fact that her hip was not able to be reduced, there was concern about soft tissue impingement.   I also felt that it was important for me to evaluate for stability of the hip is that we intentionally tried to not lengthen her right hip at the time of her primary surgery.  Unfortunately, she had been doing well and then this occurred.   Consent was  obtained to manage her right hip as discussed.  Standard risk of recurrent dislocation and instability, DVT, and infection all reviewed.  Consent was obtained for benefit of pain relief.  Laboratory Data: Admission on 12/10/2020, Discharged on 12/11/2020  Component Date Value Ref Range Status  . SARS Coronavirus 2 by RT PCR 12/10/2020 NEGATIVE  NEGATIVE Final   Comment: (NOTE) SARS-CoV-2 target nucleic acids are NOT DETECTED.  The SARS-CoV-2 RNA is generally detectable in upper respiratory specimens during the acute phase of infection. The lowest concentration of SARS-CoV-2 viral copies this assay can detect is 138 copies/mL. A negative result does not preclude SARS-Cov-2 infection and should not be used as the sole basis for treatment or other patient management decisions. A negative result may occur with  improper specimen collection/handling, submission of specimen other than nasopharyngeal swab, presence of viral mutation(s) within the areas targeted by this assay, and inadequate number of viral copies(<138 copies/mL). A negative result must be combined with clinical observations, patient history, and epidemiological information. The expected result is Negative.  Fact Sheet for Patients:  12/12/2020  Fact Sheet for Healthcare Providers:  BloggerCourse.com  This test is no                          t yet approved or cleared by the [employer] FDA and  has been authorized for detection and/or diagnosis of SARS-CoV-2 by FDA  under an Emergency Use Authorization (EUA). This EUA will remain  in effect (meaning this test can be used) for the duration of the COVID-19 declaration under Section 564(b)(1) of the Act, 21 U.S.C.section 360bbb-3(b)(1), unless the authorization is terminated  or revoked sooner.      . Influenza A by PCR 12/10/2020 NEGATIVE  NEGATIVE Final  . Influenza B by PCR 12/10/2020 NEGATIVE   NEGATIVE Final   Comment: (NOTE) The Xpert Xpress SARS-CoV-2/FLU/RSV plus assay is intended as an aid in the diagnosis of influenza from Nasopharyngeal swab specimens and should not be used as a sole basis for treatment. Nasal washings and aspirates are unacceptable for Xpert Xpress SARS-CoV-2/FLU/RSV testing.  Fact Sheet for Patients: EntrepreneurPulse.com.au  Fact Sheet for Healthcare Providers: IncredibleEmployment.be  This test is not yet approved or cleared by the [place] FDA and has been authorized for detection and/or diagnosis of SARS-CoV-2 by FDA under an Emergency Use Authorization (EUA). This EUA will remain in effect (meaning this test can be used) for the duration of the COVID-19 declaration under Section 564(b)(1) of the Act, 21 U.S.C. section 360bbb-3(b)(1), unless the authorization is terminated or revoked.  Performed at [facility], [place] [address], [place], [place] [address]   . Preg Test, Ur 12/10/2020 NEGATIVE  NEGATIVE Final   Comment:        THE SENSITIVITY OF THIS METHODOLOGY IS >24 mIU/mL      X-Rays:DG C-Arm 1-60 Min-No Report  Result Date: 12/10/2020 Fluoroscopy was utilized by the requesting physician.  No radiographic interpretation.   DG Hip Port Unilat With Pelvis 1V Right  Result Date: 12/10/2020 CLINICAL DATA:  50 year old female status post right hip revision. EXAM: DG HIP (WITH OR WITHOUT PELVIS) 1V PORT RIGHT COMPARISON:  Intraoperative fluoroscopic study dated 12/10/2020. FINDINGS: Bilateral total hip arthroplasties. The arthroplasty components appear intact and in anatomic alignment. There is no acute fracture or dislocation. Mild osteopenia. Postsurgical changes with small pockets of air in the soft tissues of the right hip. IMPRESSION: Bilateral total hip arthroplasties appear intact and in anatomic alignment. Electronically Signed   By: Anner Crete M.D.   On: 12/10/2020  15:26   DG HIP OPERATIVE UNILAT W OR W/O PELVIS RIGHT  Result Date: 12/10/2020 CLINICAL DATA:  Right hip replacement. EXAM: OPERATIVE RIGHT HIP (WITH PELVIS IF PERFORMED) 1 VIEW TECHNIQUE: Fluoroscopic spot image(s) were submitted for interpretation post-operatively. COMPARISON:  07/03/2005 FINDINGS: Single view of the right hip demonstrates an arthroplasty. No acute hardware complication or periprosthetic fracture. IMPRESSION: Intraoperative imaging of right hip arthroplasty. Electronically Signed   By: Abigail Miyamoto M.D.   On: 12/10/2020 13:57    EKG:No orders found for this or any previous visit.   Hospital Course: Julie Sloan is a 50 y.o. who was admitted to [facility]. They were brought to the operating room on 12/10/2020 and underwent Procedure(s): revision right hip single componet.  Patient tolerated the procedure well and was later transferred to the recovery room and then to the orthopaedic floor for postoperative care. They were given PO and IV analgesics for pain control following their surgery. They were given 24 hours of postoperative antibiotics of  Anti-infectives (From admission, onward)   Start     Dose/Rate Route Frequency Ordered Stop   12/11/20 0830  cephALEXin (KEFLEX) capsule 500 mg  Status:  Discontinued        500 mg Oral Every 8 hours 12/11/20 0736 12/11/20 1659   12/11/20 0000  cephALEXin (KEFLEX) 500  MG capsule        500 mg Oral Every 8 hours 12/11/20 0748 12/18/20 2359   12/10/20 1900  ceFAZolin (ANCEF) IVPB 2g/100 mL premix        2 g 200 mL/hr over 30 Minutes Intravenous Every 6 hours 12/10/20 1611 12/11/20 0133   12/10/20 1238  ceFAZolin (ANCEF) 2-4 GM/100ML-% IVPB       Note to Pharmacy: Lyda Kalata   : cabinet override      12/10/20 1238 12/11/20 0044     and started on DVT prophylaxis in the form of Aspirin.   PT and OT were ordered for total joint protocol. Discharge planning consulted to help with postop disposition and equipment needs.   Patient had a good night on the evening of surgery. They started to get up OOB with therapy on POD #1. Pt was seen during rounds and was ready to go home pending progress with therapy.  She worked with therapy on POD #1 and was meeting her goals. Pt was discharged to home later that day in stable condition.  Diet: Regular diet Activity: WBAT Follow-up: in 2 weeks Disposition: Home Discharged Condition: good   Discharge Instructions    Call MD / Call 911   Complete by: As directed    If you experience chest pain or shortness of breath, CALL 911 and be transported to the hospital emergency room.  If you develope a fever above 101 F, pus (white drainage) or increased drainage or redness at the wound, or calf pain, call your surgeon's office.   Constipation Prevention   Complete by: As directed    Drink plenty of fluids.  Prune juice may be helpful.  You may use a stool softener, such as Colace (over the counter) 100 mg twice a day.  Use MiraLax (over the counter) for constipation as needed.   Diet - low sodium heart healthy   Complete by: As directed    Increase activity slowly as tolerated   Complete by: As directed      Allergies as of 12/11/2020      Reactions   Bee Venom Anaphylaxis   Codeine Nausea And Vomiting   Levaquin [levofloxacin] Nausea And Vomiting      Medication List    STOP taking these medications   HYDROcodone-acetaminophen 5-325 MG tablet Commonly known as: NORCO/VICODIN     TAKE these medications   ALPRAZolam 0.5 MG tablet Commonly known as: XANAX Take 0.25 mg by mouth daily as needed for anxiety.   aspirin 81 MG EC tablet Take 1 tablet (81 mg total) by mouth 2 (two) times daily. What changed:   how much to take  when to take this   DSS 100 MG Caps Take 2 capsules by mouth daily.   escitalopram 20 MG tablet Commonly known as: LEXAPRO Take 20 mg by mouth daily.   ferrous sulfate 325 (65 FE) MG tablet Take 1 tablet (325 mg total) by mouth 2  (two) times daily with a meal. What changed: when to take this   Intrarosa 6.5 MG Inst Generic drug: Prasterone Place 1 suppository vaginally at bedtime.   methocarbamol 500 MG tablet Commonly known as: ROBAXIN Take 1 tablet (500 mg total) by mouth every 6 (six) hours as needed for muscle spasms.   multivitamin with minerals Tabs tablet Take 1 tablet by mouth daily.   naproxen 500 MG tablet Commonly known as: NAPROSYN Take 500 mg by mouth 2 (two) times daily as needed for moderate pain.  ASK your doctor about these medications   cephALEXin 500 MG capsule Commonly known as: KEFLEX Take 1 capsule (500 mg total) by mouth every 8 (eight) hours for 7 days. Ask about: Should I take this medication?   HYDROcodone-acetaminophen 5-325 MG tablet Commonly known as: NORCO/VICODIN Take 2 tablets by mouth every 4 (four) hours as needed for up to 7 days for moderate pain (pain score 4-6). Ask about: Should I take this medication?       Follow-up Information    Durene Romans, MD Follow up in 2 week(s).   Specialty: Orthopedic Surgery Why: wound check Contact information: [address] [place] [address] [place] [address] [phone]               Signed: Dennie Bible, PA-C Orthopedic Surgery 12/19/2020, 8:53 AM

## 2022-09-18 IMAGING — RF DG HIP (WITH PELVIS) OPERATIVE*R*
1 series · 1 of 1 positions shown · non-contrast
Comparison: 07/03/2005

CLINICAL DATA: Right hip replacement.

EXAM:
OPERATIVE RIGHT HIP (WITH PELVIS IF PERFORMED) 1 VIEW
TECHNIQUE: Fluoroscopic spot image(s) were submitted for interpretation
post-operatively.

[Series 1: unknown protocol · 0.20mm/px · 1 of 1 slices shown]
[im 1/1]
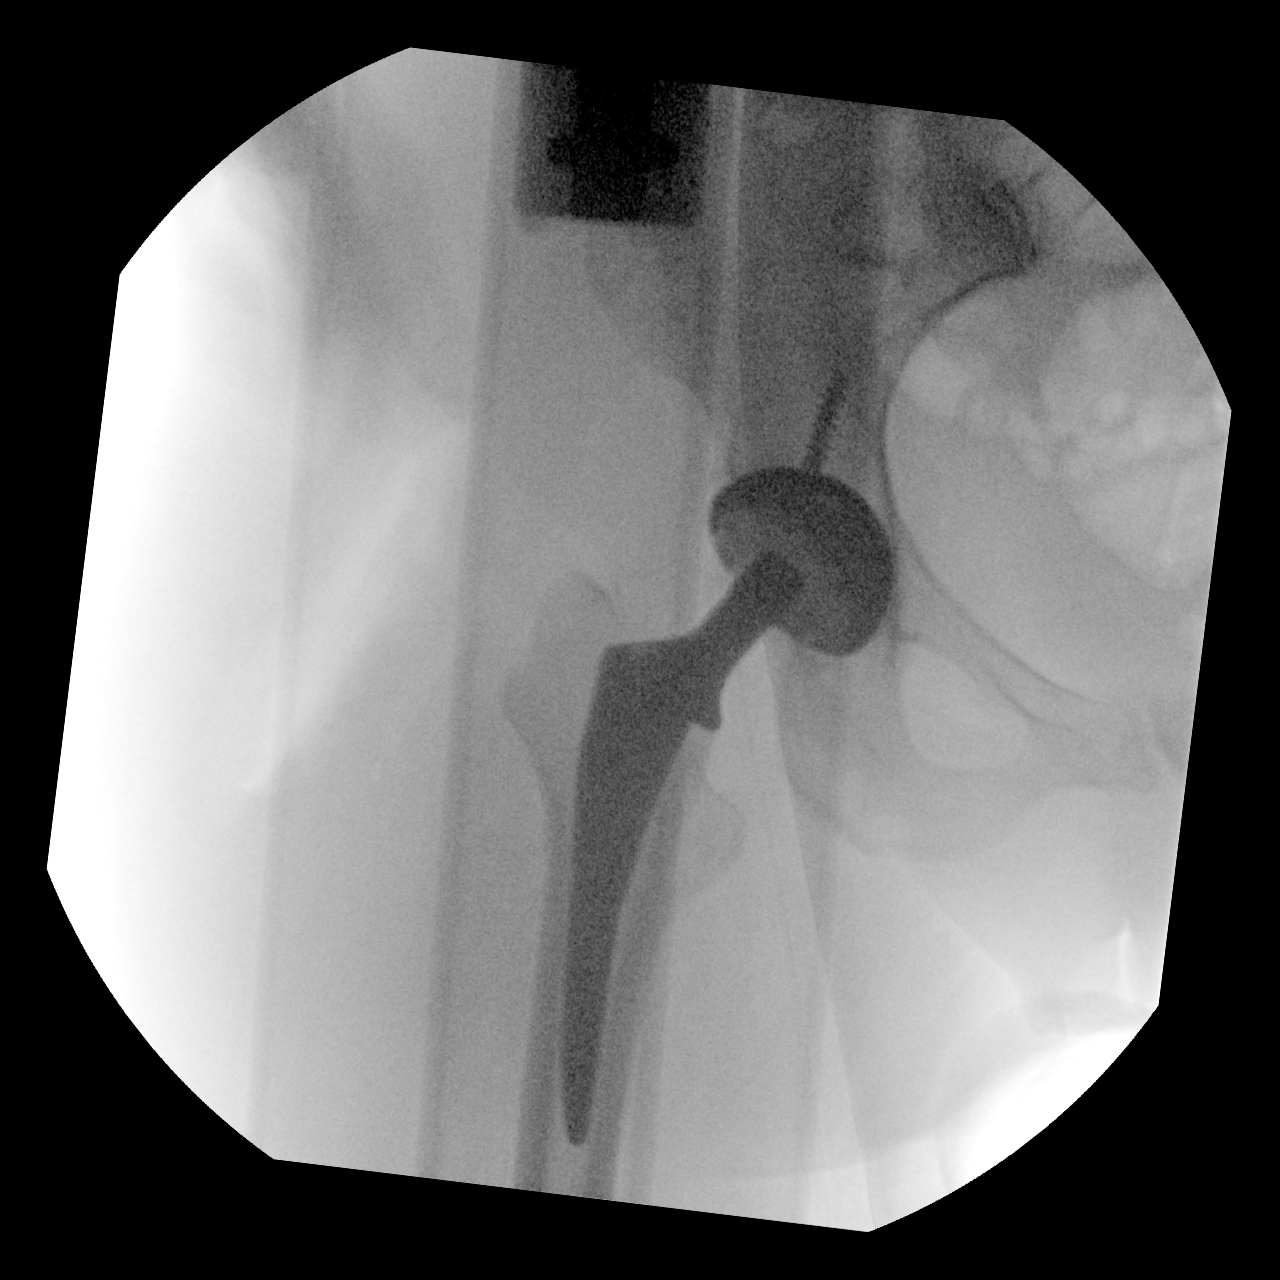

[1 of 1 positions shown; findings below may reference images not displayed]

FINDINGS: Single view of the right hip demonstrates an arthroplasty. No acute
hardware complication or periprosthetic fracture.
IMPRESSION: Intraoperative imaging of right hip arthroplasty.
